# Patient Record
Sex: Male | Born: 1985 | Race: Asian | Hispanic: No | Marital: Married | Smoking: Current every day smoker
Health system: Southern US, Community
[De-identification: ages and names within clinical notes are randomized; demographics above are authoritative.]

---

## 2021-01-28 ENCOUNTER — Ambulatory Visit (HOSPITAL_COMMUNITY): Payer: Self-pay

## 2021-06-23 ENCOUNTER — Ambulatory Visit: Payer: Self-pay | Admitting: Nurse Practitioner

## 2021-07-18 ENCOUNTER — Other Ambulatory Visit: Payer: Self-pay

## 2021-07-18 ENCOUNTER — Ambulatory Visit (INDEPENDENT_AMBULATORY_CARE_PROVIDER_SITE_OTHER): Payer: Managed Care, Other (non HMO) | Admitting: Family

## 2021-07-18 ENCOUNTER — Encounter: Payer: Self-pay | Admitting: Family

## 2021-07-18 VITALS — BP 100/70 | HR 72 | Temp 97.5°F | Ht 67.0 in | Wt 152.0 lb

## 2021-07-18 DIAGNOSIS — Z13220 Encounter for screening for lipoid disorders: Secondary | ICD-10-CM

## 2021-07-18 DIAGNOSIS — R109 Unspecified abdominal pain: Secondary | ICD-10-CM

## 2021-07-18 DIAGNOSIS — R300 Dysuria: Secondary | ICD-10-CM

## 2021-07-18 DIAGNOSIS — Z23 Encounter for immunization: Secondary | ICD-10-CM

## 2021-07-18 DIAGNOSIS — Z1159 Encounter for screening for other viral diseases: Secondary | ICD-10-CM

## 2021-07-18 DIAGNOSIS — Z1329 Encounter for screening for other suspected endocrine disorder: Secondary | ICD-10-CM

## 2021-07-18 DIAGNOSIS — Z7689 Persons encountering health services in other specified circumstances: Secondary | ICD-10-CM

## 2021-07-18 DIAGNOSIS — Z113 Encounter for screening for infections with a predominantly sexual mode of transmission: Secondary | ICD-10-CM

## 2021-07-18 DIAGNOSIS — R10A3 Flank pain, bilateral: Secondary | ICD-10-CM

## 2021-07-18 LAB — POCT URINALYSIS DIPSTICK
Bilirubin, UA: NEGATIVE
Glucose, UA: NEGATIVE
Ketones, UA: NEGATIVE
Leukocytes, UA: NEGATIVE
Nitrite, UA: NEGATIVE
Protein, UA: NEGATIVE
Spec Grav, UA: 1.02 (ref 1.010–1.025)
Urobilinogen, UA: 0.2 E.U./dL
pH, UA: 7.5 (ref 5.0–8.0)
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
## 2021-07-18 NOTE — Patient Instructions (Signed)
-   Please go to [facility] and get your Tetanus vaccine. ? ?- Renal ultrasound ordered imaging Center will call you to schedule for appointment  ? ?- Urine specimen send for culture will call you with results in 3 days then call in antibiotics if there's any urinary tract infection  ?

## 2021-07-20 LAB — COMPLETE METABOLIC PANEL WITH GFR
AG Ratio: 1.8 (calc) (ref 1.0–2.5)
ALT: 10 U/L (ref 9–46)
AST: 14 U/L (ref 10–40)
Albumin: 4.4 g/dL (ref 3.6–5.1)
Alkaline phosphatase (APISO): 71 U/L (ref 36–130)
BUN: 9 mg/dL (ref 7–25)
CO2: 30 mmol/L (ref 20–32)
Calcium: 9.3 mg/dL (ref 8.6–10.3)
Chloride: 106 mmol/L (ref 98–110)
Creat: 0.72 mg/dL (ref 0.60–1.26)
Globulin: 2.4 g/dL (calc) (ref 1.9–3.7)
Glucose, Bld: 91 mg/dL (ref 65–99)
Potassium: 4.5 mmol/L (ref 3.5–5.3)
Sodium: 140 mmol/L (ref 135–146)
Total Bilirubin: 0.9 mg/dL (ref 0.2–1.2)
Total Protein: 6.8 g/dL (ref 6.1–8.1)
eGFR: 120 mL/min/{1.73_m2} (ref >=60)

## 2021-07-20 LAB — CBC WITH DIFFERENTIAL/PLATELET
Absolute Monocytes: 328 cells/uL (ref 200–950)
Basophils Absolute: 50 cells/uL (ref 0–200)
Basophils Relative: 0.8 %
Eosinophils Absolute: 447 cells/uL (ref 15–500)
Eosinophils Relative: 7.1 %
HCT: 49 % (ref 38.5–50.0)
Hemoglobin: 16.5 g/dL (ref 13.2–17.1)
Lymphs Abs: 1632 cells/uL (ref 850–3900)
MCH: 29.2 pg (ref 27.0–33.0)
MCHC: 33.7 g/dL (ref 32.0–36.0)
MCV: 86.6 fL (ref 80.0–100.0)
MPV: 10.1 fL (ref 7.5–12.5)
Monocytes Relative: 5.2 %
Neutro Abs: 3843 cells/uL (ref 1500–7800)
Neutrophils Relative %: 61 %
Platelets: 227 10*3/uL (ref 140–400)
RBC: 5.66 10*6/uL (ref 4.20–5.80)
RDW: 11.8 % (ref 11.0–15.0)
Total Lymphocyte: 25.9 %
WBC: 6.3 10*3/uL (ref 3.8–10.8)

## 2021-07-20 LAB — HEPATITIS C ANTIBODY
Hepatitis C Ab: NONREACTIVE
SIGNAL TO CUT-OFF: 0.08 (ref <1.00)

## 2021-07-20 LAB — LIPID PANEL
Cholesterol: 154 mg/dL (ref <200)
HDL: 52 mg/dL (ref >=40)
LDL Cholesterol (Calc): 84 mg/dL (calc)
Non-HDL Cholesterol (Calc): 102 mg/dL (calc) (ref <130)
Total CHOL/HDL Ratio: 3 (calc) (ref <5.0)
Triglycerides: 90 mg/dL (ref <150)

## 2021-07-20 LAB — URINE CULTURE
MICRO NUMBER:: 13092555
Result:: NO GROWTH
SPECIMEN QUALITY:: ADEQUATE

## 2021-07-20 LAB — TSH: TSH: 0.98 mIU/L (ref 0.40–4.50)

## 2021-07-20 LAB — HIV ANTIBODY (ROUTINE TESTING W REFLEX): HIV 1&2 Ab, 4th Generation: NONREACTIVE

## 2021-07-27 ENCOUNTER — Ambulatory Visit
Admission: RE | Admit: 2021-07-27 | Discharge: 2021-07-27 | Disposition: A | Discharge status: Home / Self Care | Payer: Self-pay | Source: Ambulatory Visit | Attending: Family | Admitting: Family

## 2021-09-13 ENCOUNTER — Telehealth: Payer: Self-pay | Admitting: *Deleted

## 2021-09-13 NOTE — Telephone Encounter (Signed)
Brent Simmons [phone] dropped off [facility] Physical Form to have filled out for patient.  ? ?Per Dinah, Patient needs appointment to have completed.  ? ?Called and LMOM with Simmons to return call.  ?Awaiting callback.  ?

## 2021-12-13 ENCOUNTER — Encounter: Payer: Self-pay | Admitting: Physician Assistant

## 2021-12-13 ENCOUNTER — Ambulatory Visit: Payer: BC Managed Care – PPO | Admitting: Physician Assistant

## 2021-12-13 VITALS — BP 100/64 | HR 87 | Temp 98.3°F | Ht 67.0 in | Wt 143.2 lb

## 2021-12-13 DIAGNOSIS — N281 Cyst of kidney, acquired: Secondary | ICD-10-CM

## 2021-12-13 DIAGNOSIS — R109 Unspecified abdominal pain: Secondary | ICD-10-CM

## 2021-12-13 DIAGNOSIS — N200 Calculus of kidney: Secondary | ICD-10-CM

## 2021-12-13 LAB — POC URINALSYSI DIPSTICK (AUTOMATED)
Bilirubin, UA: NEGATIVE
Blood, UA: NEGATIVE
Glucose, UA: NEGATIVE
Ketones, UA: NEGATIVE
Leukocytes, UA: NEGATIVE
Nitrite, UA: NEGATIVE
Protein, UA: NEGATIVE
Spec Grav, UA: 1.01 (ref 1.010–1.025)
Urobilinogen, UA: 0.2 E.U./dL
pH, UA: 7.5 (ref 5.0–8.0)

## 2021-12-13 MED ORDER — ACETAMINOPHEN ER 650 MG PO TBCR: 650.0000 mg | EXTENDED_RELEASE_TABLET | Freq: Three times a day (TID) | ORAL | 0 refills | Status: DC

## 2021-12-13 MED ORDER — MELOXICAM 15 MG PO TABS: 15.0000 mg | ORAL_TABLET | Freq: Every day | ORAL | 0 refills | Status: DC

## 2021-12-13 NOTE — Patient Instructions (Addendum)
Welcome to [facility]! It was a pleasure meeting you today.  Please check urine. Repeat US of kidneys - order placed today. Drink plenty of water. Take Tylenol and Meloxicam as directed for possible muscular cause of pain. Try ice or heat to help.  Referral to urology as well.   As discussed, Please schedule a 1-2 month follow up visit today.  PLEASE NOTE:  If you had any LAB tests please let us know if you have not heard back within a few days. You may see your results on MyChart before we have a chance to review them but we will give you a call once they are reviewed by [place]. If we ordered any REFERRALS today, please let us know if you have not heard from their office within the next two weeks. Let us know through MyChart if you are needing REFILLS, or have your pharmacy send [place] the request. You can also use MyChart to communicate with me or any office staff.  Please try these tips to maintain a healthy lifestyle:  Eat most of your calories during the day when you are active. Eliminate processed foods including packaged sweets (pies, cakes, cookies), reduce intake of potatoes, white bread, white pasta, and white rice. Look for whole grain options, oat flour or almond flour.  Each meal should contain half fruits/vegetables, one quarter protein, and one quarter carbs (no bigger than a computer mouse).  Cut down on sweet beverages. This includes juice, soda, and sweet tea. Also watch fruit intake, though this is a healthier sweet option, it still contains natural sugar! Limit to 3 servings daily.  Drink at least 1 glass of water with each meal and aim for at least 8 glasses (64 ounces) per day.  Exercise at least 150 minutes every week to the best of your ability.    Take Care,  Keelin Sheridan, PA-C
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
## 2021-12-14 LAB — URINE CULTURE
MICRO NUMBER:: 13720732
Result:: NO GROWTH
SPECIMEN QUALITY:: ADEQUATE

## 2021-12-31 ENCOUNTER — Other Ambulatory Visit: Payer: Self-pay | Admitting: Physician Assistant

## 2022-01-03 ENCOUNTER — Ambulatory Visit
Admission: RE | Admit: 2022-01-03 | Discharge: 2022-01-03 | Disposition: A | Discharge status: Home / Self Care | Payer: BC Managed Care – PPO | Source: Ambulatory Visit | Attending: Physician Assistant | Admitting: Physician Assistant

## 2022-01-03 DIAGNOSIS — N281 Cyst of kidney, acquired: Secondary | ICD-10-CM

## 2022-01-03 DIAGNOSIS — N200 Calculus of kidney: Secondary | ICD-10-CM

## 2022-01-03 DIAGNOSIS — R109 Unspecified abdominal pain: Secondary | ICD-10-CM

## 2022-01-10 ENCOUNTER — Ambulatory Visit: Payer: BC Managed Care – PPO | Admitting: Physician Assistant

## 2022-01-12 ENCOUNTER — Ambulatory Visit: Payer: BC Managed Care – PPO | Admitting: Physician Assistant

## 2022-01-20 ENCOUNTER — Encounter: Payer: Self-pay | Admitting: Physician Assistant

## 2022-01-20 ENCOUNTER — Ambulatory Visit: Payer: BC Managed Care – PPO | Admitting: Physician Assistant

## 2022-01-20 VITALS — BP 129/78 | HR 67 | Temp 98.3°F | Ht 67.0 in | Wt 145.2 lb

## 2022-01-20 DIAGNOSIS — G479 Sleep disorder, unspecified: Secondary | ICD-10-CM

## 2022-01-20 DIAGNOSIS — R109 Unspecified abdominal pain: Secondary | ICD-10-CM | POA: Diagnosis not present

## 2022-01-20 DIAGNOSIS — K219 Gastro-esophageal reflux disease without esophagitis: Secondary | ICD-10-CM

## 2022-01-20 DIAGNOSIS — N281 Cyst of kidney, acquired: Secondary | ICD-10-CM | POA: Diagnosis not present

## 2022-01-20 DIAGNOSIS — N2889 Other specified disorders of kidney and ureter: Secondary | ICD-10-CM

## 2022-01-20 DIAGNOSIS — R1084 Generalized abdominal pain: Secondary | ICD-10-CM

## 2022-01-20 LAB — COMPREHENSIVE METABOLIC PANEL
ALT: 14 U/L (ref 0–53)
AST: 16 U/L (ref 0–37)
Albumin: 3.9 g/dL (ref 3.5–5.2)
Alkaline Phosphatase: 73 U/L (ref 39–117)
BUN: 11 mg/dL (ref 6–23)
CO2: 28 mEq/L (ref 19–32)
Calcium: 8.8 mg/dL (ref 8.4–10.5)
Chloride: 105 mEq/L (ref 96–112)
Creatinine, Ser: 1 mg/dL (ref 0.40–1.50)
GFR: 95.36 mL/min (ref >60.00)
Glucose, Bld: 98 mg/dL (ref 70–99)
Potassium: 3.3 mEq/L — ABNORMAL LOW (ref 3.5–5.1)
Sodium: 141 mEq/L (ref 135–145)
Total Bilirubin: 0.8 mg/dL (ref 0.2–1.2)
Total Protein: 6.5 g/dL (ref 6.0–8.3)

## 2022-01-20 LAB — CBC WITH DIFFERENTIAL/PLATELET
Basophils Absolute: 0.1 10*3/uL (ref 0.0–0.1)
Basophils Relative: 0.8 % (ref 0.0–3.0)
Eosinophils Absolute: 0.6 10*3/uL (ref 0.0–0.7)
Eosinophils Relative: 7.1 % — ABNORMAL HIGH (ref 0.0–5.0)
HCT: 43.1 % (ref 39.0–52.0)
Hemoglobin: 14.7 g/dL (ref 13.0–17.0)
Lymphocytes Relative: 27.9 % (ref 12.0–46.0)
Lymphs Abs: 2.4 10*3/uL (ref 0.7–4.0)
MCHC: 34 g/dL (ref 30.0–36.0)
MCV: 87.6 fl (ref 78.0–100.0)
Monocytes Absolute: 0.6 10*3/uL (ref 0.1–1.0)
Monocytes Relative: 6.5 % (ref 3.0–12.0)
Neutro Abs: 4.9 10*3/uL (ref 1.4–7.7)
Neutrophils Relative %: 57.7 % (ref 43.0–77.0)
Platelets: 215 10*3/uL (ref 150.0–400.0)
RBC: 4.92 Mil/uL (ref 4.22–5.81)
RDW: 13.1 % (ref 11.5–15.5)
WBC: 8.5 10*3/uL (ref 4.0–10.5)

## 2022-01-20 LAB — LIPASE: Lipase: 27 U/L (ref 11.0–59.0)

## 2022-01-20 MED ORDER — OMEPRAZOLE 20 MG PO CPDR: 20.0000 mg | DELAYED_RELEASE_CAPSULE | Freq: Two times a day (BID) | ORAL | 0 refills | Status: DC

## 2022-01-20 MED ORDER — TRAZODONE HCL 50 MG PO TABS: ORAL_TABLET | ORAL | 1 refills | Status: DC
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
## 2022-01-20 NOTE — Patient Instructions (Signed)
Labs & flu shot today  Someone will call to schedule CT abdomen with contrast. Go to the EMERGENCY DEPT if acutely worse pain.  Start Omeprazole and Trazodone as directed.  Call sooner if any concerns.   Take care!

## 2022-01-31 ENCOUNTER — Telehealth: Payer: Self-pay

## 2022-01-31 NOTE — Telephone Encounter (Signed)
Called Brent Simmons back, he is changing the ct to w/ and w/o contrast of pelvic and abd

## 2022-01-31 NOTE — Telephone Encounter (Signed)
Ct was ordered with contrast, [facility] called and wants to know if you want a ct with and without contrast, so they can get a better picture of the pelvis as well.

## 2022-02-02 ENCOUNTER — Ambulatory Visit
Admission: RE | Admit: 2022-02-02 | Discharge: 2022-02-02 | Disposition: A | Discharge status: Home / Self Care | Payer: BC Managed Care – PPO | Source: Ambulatory Visit | Attending: Physician Assistant | Admitting: Physician Assistant

## 2022-02-02 DIAGNOSIS — R109 Unspecified abdominal pain: Secondary | ICD-10-CM

## 2022-02-02 DIAGNOSIS — N2889 Other specified disorders of kidney and ureter: Secondary | ICD-10-CM

## 2022-02-02 DIAGNOSIS — N281 Cyst of kidney, acquired: Secondary | ICD-10-CM

## 2022-02-02 DIAGNOSIS — R1084 Generalized abdominal pain: Secondary | ICD-10-CM

## 2022-02-02 DIAGNOSIS — K219 Gastro-esophageal reflux disease without esophagitis: Secondary | ICD-10-CM

## 2022-02-02 MED ORDER — IOPAMIDOL (ISOVUE-300) INJECTION 61%
100.0000 mL | Freq: Once | INTRAVENOUS | Status: AC | PRN
  Administered 2022-02-02: 100 mL via INTRAVENOUS

## 2022-04-14 ENCOUNTER — Encounter: Payer: Self-pay | Admitting: Physician Assistant

## 2022-04-14 ENCOUNTER — Ambulatory Visit: Payer: BC Managed Care – PPO | Admitting: Physician Assistant

## 2022-04-14 VITALS — BP 124/66 | HR 64 | Temp 97.0°F | Ht 67.0 in | Wt 139.6 lb

## 2022-04-14 DIAGNOSIS — R634 Abnormal weight loss: Secondary | ICD-10-CM

## 2022-04-14 DIAGNOSIS — K219 Gastro-esophageal reflux disease without esophagitis: Secondary | ICD-10-CM | POA: Diagnosis not present

## 2022-04-14 DIAGNOSIS — Z23 Encounter for immunization: Secondary | ICD-10-CM

## 2022-04-14 DIAGNOSIS — G479 Sleep disorder, unspecified: Secondary | ICD-10-CM | POA: Diagnosis not present

## 2022-04-14 DIAGNOSIS — R109 Unspecified abdominal pain: Secondary | ICD-10-CM | POA: Diagnosis not present

## 2022-04-14 DIAGNOSIS — R141 Gas pain: Secondary | ICD-10-CM

## 2022-04-14 MED ORDER — OMEPRAZOLE 20 MG PO CPDR: 20.0000 mg | DELAYED_RELEASE_CAPSULE | Freq: Two times a day (BID) | ORAL | 2 refills | Status: DC

## 2022-04-14 MED ORDER — TRAZODONE HCL 50 MG PO TABS: ORAL_TABLET | ORAL | 2 refills | Status: DC

## 2022-04-14 NOTE — Patient Instructions (Addendum)
Please call Brent Simmons to schedule with Dr. Leonides Schanz - [phone] Trial Low-FODMAP eating plan to reduce gas pain  Continue on prilosec to help with acid symptoms  Continue Trazodone to help with sleep
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
## 2022-07-13 ENCOUNTER — Ambulatory Visit: Payer: BC Managed Care – PPO | Admitting: Physician Assistant

## 2022-07-17 ENCOUNTER — Other Ambulatory Visit: Payer: BC Managed Care – PPO

## 2022-07-17 ENCOUNTER — Other Ambulatory Visit: Payer: Self-pay

## 2022-07-21 ENCOUNTER — Ambulatory Visit: Payer: BC Managed Care – PPO | Admitting: Physician Assistant

## 2022-07-21 ENCOUNTER — Encounter: Payer: BC Managed Care – PPO | Admitting: Family

## 2022-08-01 ENCOUNTER — Ambulatory Visit: Payer: BC Managed Care – PPO | Admitting: Physician Assistant

## 2022-08-28 ENCOUNTER — Ambulatory Visit: Payer: BC Managed Care – PPO | Admitting: Physician Assistant

## 2022-08-28 ENCOUNTER — Encounter: Payer: Self-pay | Admitting: Physician Assistant

## 2022-08-28 VITALS — BP 116/72 | HR 88 | Temp 98.2°F | Ht 67.0 in | Wt 144.8 lb

## 2022-08-28 DIAGNOSIS — G479 Sleep disorder, unspecified: Secondary | ICD-10-CM | POA: Diagnosis not present

## 2022-08-28 DIAGNOSIS — R109 Unspecified abdominal pain: Secondary | ICD-10-CM

## 2022-08-28 DIAGNOSIS — K219 Gastro-esophageal reflux disease without esophagitis: Secondary | ICD-10-CM | POA: Diagnosis not present

## 2022-08-28 MED ORDER — TRAZODONE HCL 50 MG PO TABS: ORAL_TABLET | ORAL | 2 refills | Status: DC

## 2022-08-28 MED ORDER — OMEPRAZOLE 20 MG PO CPDR: 20.0000 mg | DELAYED_RELEASE_CAPSULE | Freq: Two times a day (BID) | ORAL | 2 refills | Status: DC

## 2022-08-28 NOTE — Assessment & Plan Note (Signed)
Seems controlled / stable with Omeprazole 20 mg BID, refilled today Monitor diet / triggers

## 2022-08-28 NOTE — Patient Instructions (Addendum)
Please call [facility] to schedule your appointment [phone]  Refilled omeprazole and trazodone - please call for refills when needed  Call sooner if any concerns
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
## 2022-08-28 NOTE — Assessment & Plan Note (Signed)
Better with Trazodone 50 mg, refilled today  Pt approved for Asylum - happy to start process of petitioning to have family here

## 2023-01-23 ENCOUNTER — Other Ambulatory Visit: Payer: Self-pay | Admitting: Physician Assistant

## 2023-02-27 ENCOUNTER — Ambulatory Visit: Payer: BC Managed Care – PPO | Admitting: Physician Assistant

## 2023-03-15 ENCOUNTER — Ambulatory Visit: Payer: BC Managed Care – PPO | Admitting: Physician Assistant

## 2023-03-29 IMAGING — US US RENAL
1 series · 14 of 25 positions shown · non-contrast
Comparison: None.

CLINICAL DATA: Bilateral flank pain.

EXAM:
RENAL / URINARY TRACT ULTRASOUND COMPLETE

[Series 1: us renal · 0.23mm/px · 14 of 38 slices shown]
[im 1/38]
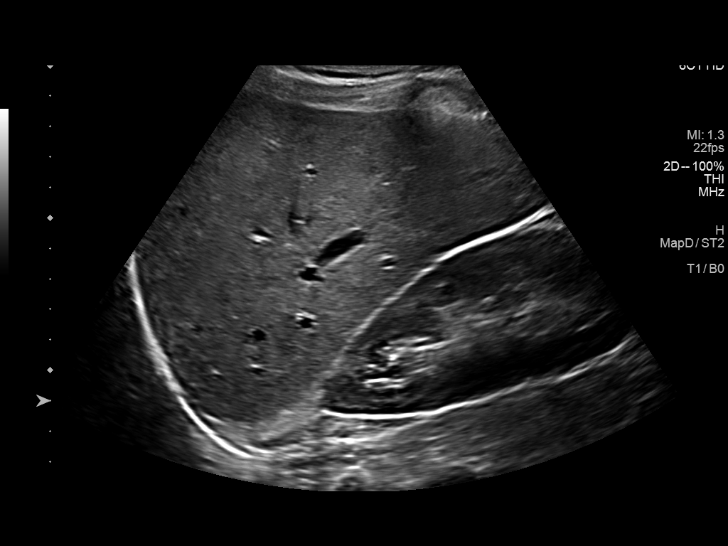
[im 4/38]
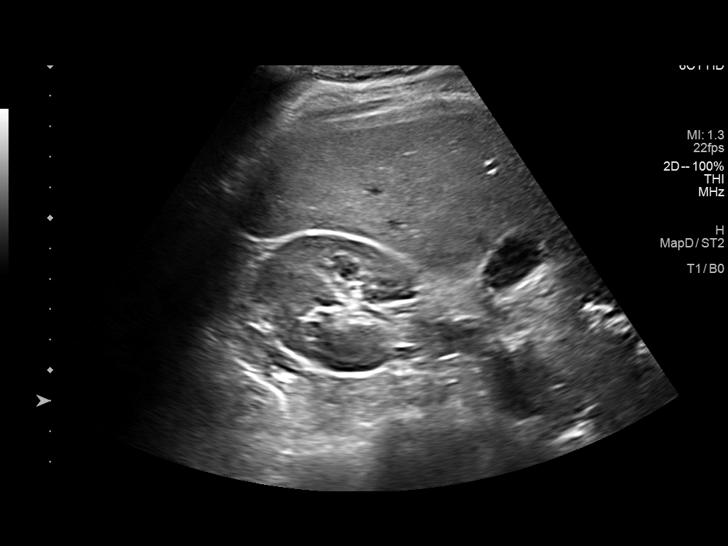
[im 7/38]
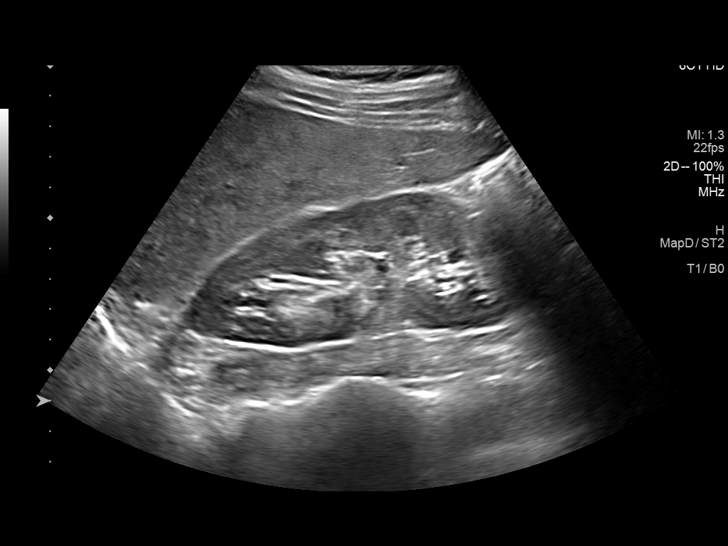
[im 10/38]
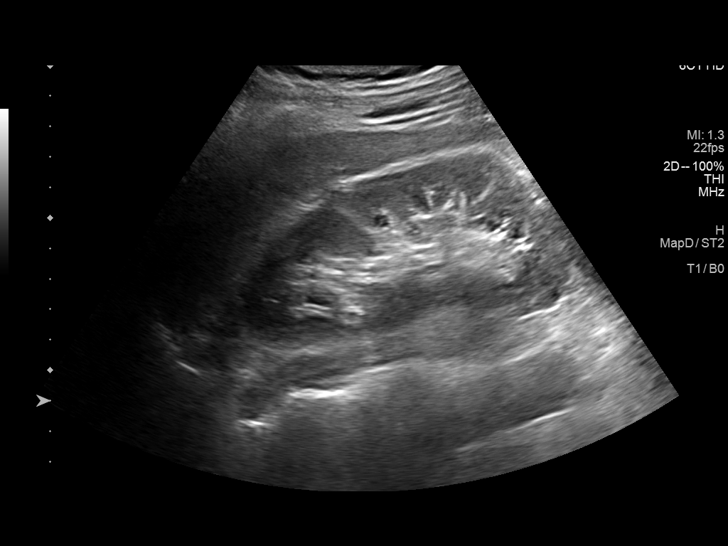
[im 13/38]
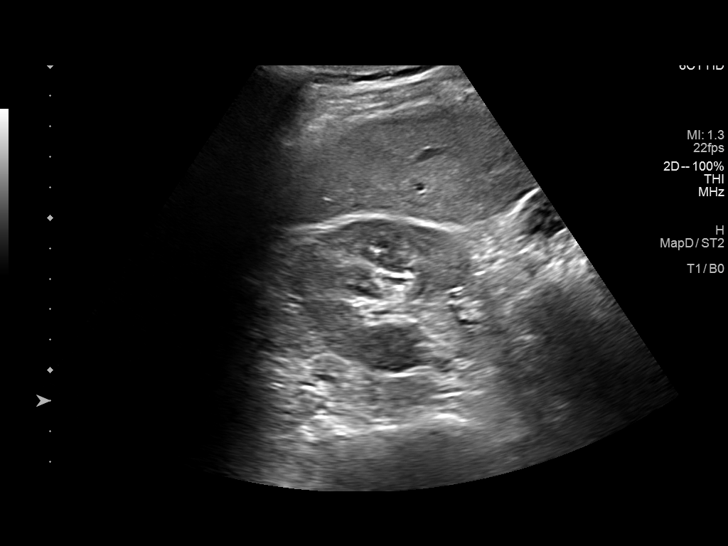
[im 14/38]
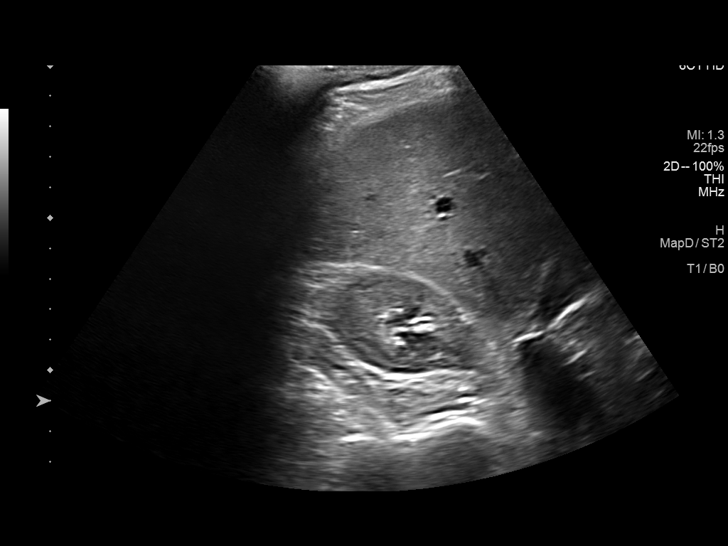
[im 17/38]
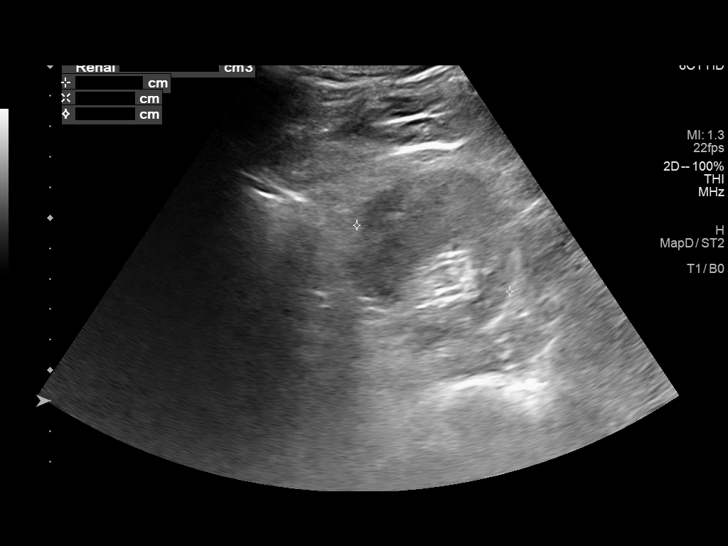
[im 21/38]
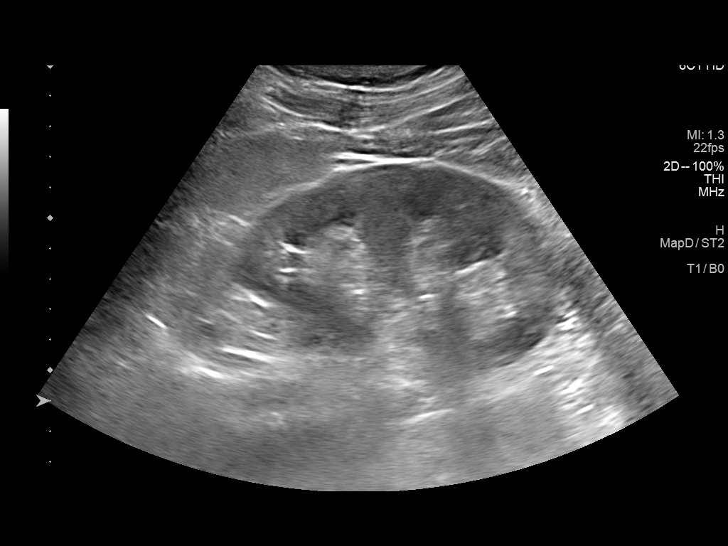
[im 24/38]
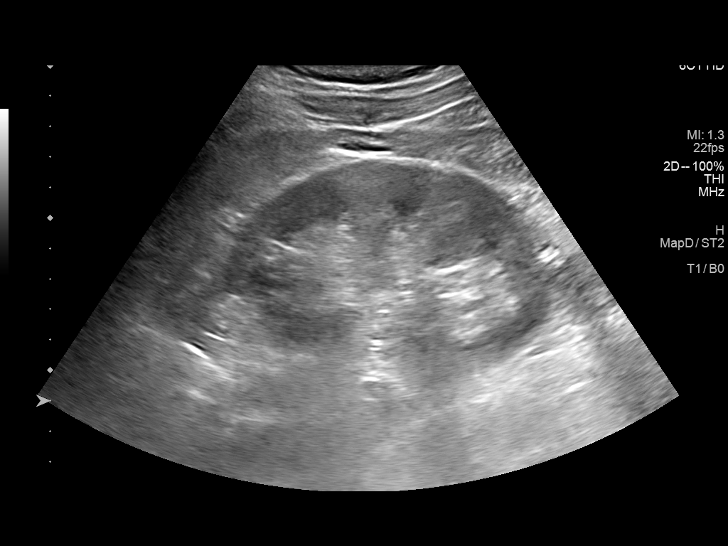
[im 25/38]
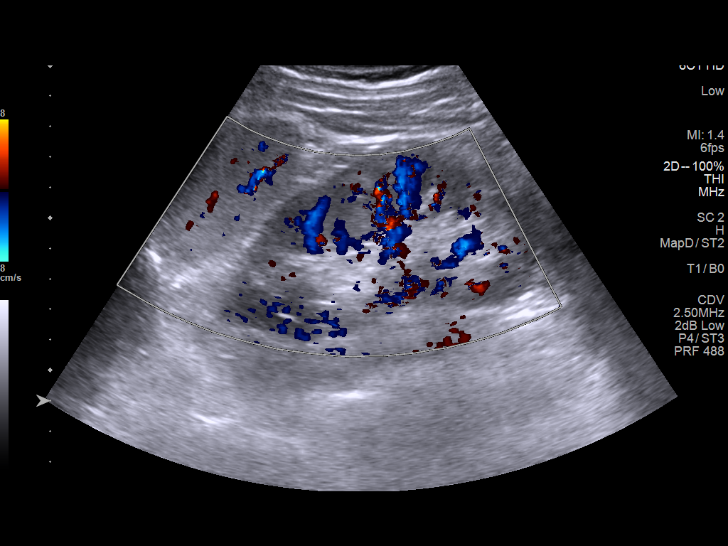
[im 28/38]
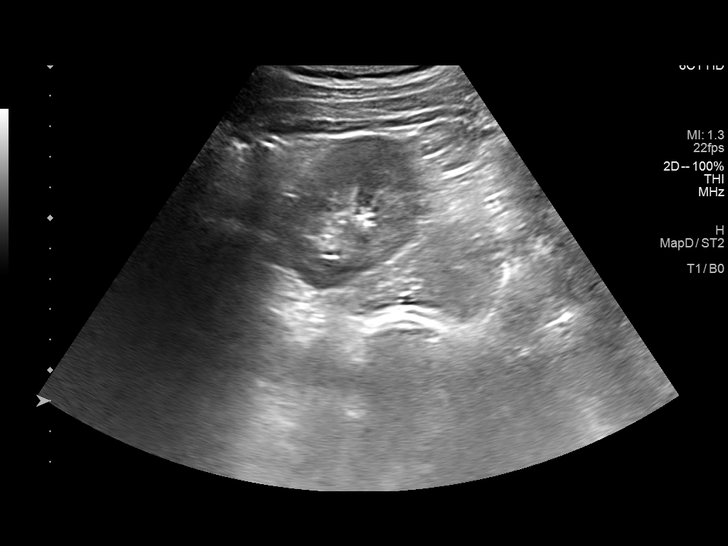
[im 31/38]
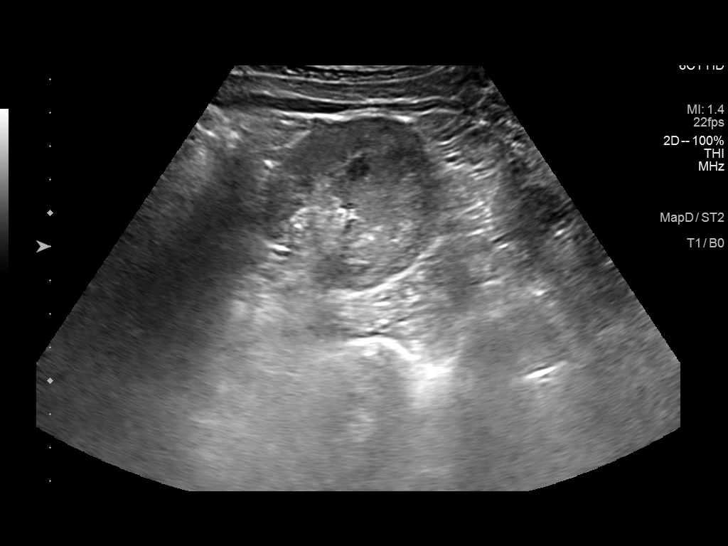
[im 34/38]
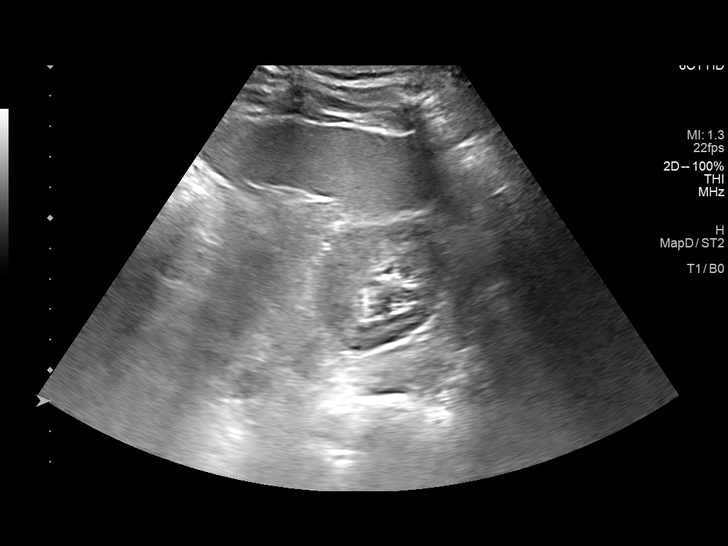
[im 38/38]
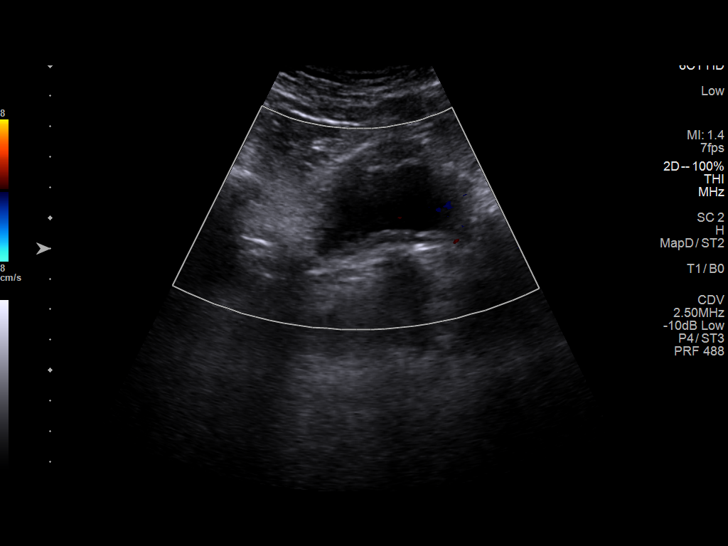

[14 of 25 positions shown; findings below may reference images not displayed]

FINDINGS: Right Kidney:

Renal measurements: 11.7 cm x 4.6 cm x 4.6 cm = volume: 131.7 mL.
Echogenicity within normal limits. No mass or hydronephrosis
visualized.

Left Kidney:

Renal measurements: 10.6 cm x 6.3 cm x 5.5 cm = volume: 190.9 mL.
Echogenicity within normal limits. A 0.7 cm x 0.8 cm x 0.6 cm
complex appearing hypoechoic echo focus is seen within the mid left
kidney. No abnormal flow is noted within this region on color
Doppler evaluation. No hydronephrosis is visualized.

Bladder:

Poorly distended and subsequently limited in evaluation.

Other:

None.
IMPRESSION: Small complex left renal cyst. Correlation with follow-up renal
ultrasound in 6 months is recommended to determine stability.

## 2023-04-19 ENCOUNTER — Ambulatory Visit (HOSPITAL_COMMUNITY)
Admission: RE | Admit: 2023-04-19 | Discharge: 2023-04-19 | Disposition: A | Discharge status: Home / Self Care | Payer: BC Managed Care – PPO | Source: Ambulatory Visit | Attending: Internal Medicine | Admitting: Internal Medicine

## 2023-04-19 ENCOUNTER — Encounter (HOSPITAL_COMMUNITY): Payer: Self-pay

## 2023-04-19 VITALS — BP 117/74 | HR 75 | Temp 99.2°F | Resp 12 | Ht 67.0 in | Wt 144.8 lb

## 2023-04-19 DIAGNOSIS — L02811 Cutaneous abscess of head [any part, except face]: Secondary | ICD-10-CM

## 2023-04-19 MED ORDER — DOXYCYCLINE HYCLATE 100 MG PO CAPS: 100.0000 mg | ORAL_CAPSULE | Freq: Two times a day (BID) | ORAL | 0 refills | Status: AC

## 2023-04-19 NOTE — Discharge Instructions (Signed)
I have prescribed an antibiotic to treat possible infection/abscess to your scalp.  Please go to the emergency department if you do not see improvement in the next 48 to 72 hours or if it worsens.  I have also placed a referral to general surgery.  They should call you to schedule an appointment.  If they do not call you, please call them yourself at provided phone number.
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
## 2023-04-19 NOTE — ED Triage Notes (Signed)
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  He has an abcess on the back of his head above his ear on the right side. He works at [employer] and has [employer]. - Entered by patient"  Interpreter: Omid [id number]   Onset of bump on the right posterior of the head 20-30 days. States it gets feverish and is painful. No history of masses. Has not taken any pain medication. No drainage.

## 2023-04-27 ENCOUNTER — Ambulatory Visit
Admission: RE | Admit: 2023-04-27 | Discharge: 2023-04-27 | Disposition: A | Discharge status: Home / Self Care | Payer: BC Managed Care – PPO | Source: Ambulatory Visit | Attending: Family Medicine | Admitting: Family Medicine

## 2023-04-27 ENCOUNTER — Other Ambulatory Visit: Payer: Self-pay | Admitting: Family Medicine

## 2023-04-27 DIAGNOSIS — Z111 Encounter for screening for respiratory tuberculosis: Secondary | ICD-10-CM
